# Patient Record
Sex: Female | Born: 1970 | Race: White | Hispanic: No | Smoking: Never smoker
Health system: Southern US, Community
[De-identification: ages and names within clinical notes are randomized; demographics above are authoritative.]

## PROBLEM LIST (undated history)

## (undated) DIAGNOSIS — T7840XA Allergy, unspecified, initial encounter: Secondary | ICD-10-CM

## (undated) DIAGNOSIS — R030 Elevated blood-pressure reading, without diagnosis of hypertension: Secondary | ICD-10-CM

## (undated) DIAGNOSIS — E669 Obesity, unspecified: Secondary | ICD-10-CM

## (undated) DIAGNOSIS — I10 Essential (primary) hypertension: Secondary | ICD-10-CM

## (undated) HISTORY — DX: Elevated blood-pressure reading, without diagnosis of hypertension: R03.0

## (undated) HISTORY — DX: Essential (primary) hypertension: I10

## (undated) HISTORY — PX: TUBAL LIGATION: SHX77

## (undated) HISTORY — DX: Allergy, unspecified, initial encounter: T78.40XA

## (undated) HISTORY — DX: Obesity, unspecified: E66.9

---

## 1999-07-14 ENCOUNTER — Other Ambulatory Visit: Admission: RE | Admit: 1999-07-14 | Discharge: 1999-07-14 | Payer: Self-pay | Admitting: *Deleted

## 2000-08-30 ENCOUNTER — Other Ambulatory Visit: Admission: RE | Admit: 2000-08-30 | Discharge: 2000-08-30 | Payer: Self-pay | Admitting: *Deleted

## 2001-11-04 ENCOUNTER — Inpatient Hospital Stay (HOSPITAL_COMMUNITY): Admission: AD | Admit: 2001-11-04 | Discharge: 2001-11-04 | Payer: Self-pay | Admitting: *Deleted

## 2001-11-05 ENCOUNTER — Inpatient Hospital Stay (HOSPITAL_COMMUNITY): Admission: AD | Admit: 2001-11-05 | Discharge: 2001-11-07 | Payer: Self-pay | Admitting: Obstetrics and Gynecology

## 2001-12-07 ENCOUNTER — Other Ambulatory Visit: Admission: RE | Admit: 2001-12-07 | Discharge: 2001-12-07 | Payer: Self-pay | Admitting: Obstetrics and Gynecology

## 2003-01-01 ENCOUNTER — Other Ambulatory Visit: Admission: RE | Admit: 2003-01-01 | Discharge: 2003-01-01 | Payer: Self-pay | Admitting: *Deleted

## 2003-02-02 ENCOUNTER — Ambulatory Visit (HOSPITAL_COMMUNITY): Admission: RE | Admit: 2003-02-02 | Discharge: 2003-02-02 | Payer: Self-pay | Admitting: *Deleted

## 2004-06-03 ENCOUNTER — Emergency Department (HOSPITAL_COMMUNITY): Admission: EM | Admit: 2004-06-03 | Discharge: 2004-06-04 | Payer: Self-pay | Admitting: Emergency Medicine

## 2004-10-02 ENCOUNTER — Other Ambulatory Visit: Admission: RE | Admit: 2004-10-02 | Discharge: 2004-10-02 | Payer: Self-pay | Admitting: Obstetrics and Gynecology

## 2005-11-17 ENCOUNTER — Other Ambulatory Visit: Admission: RE | Admit: 2005-11-17 | Discharge: 2005-11-17 | Payer: Self-pay | Admitting: Obstetrics and Gynecology

## 2005-11-25 ENCOUNTER — Encounter: Admission: RE | Admit: 2005-11-25 | Discharge: 2005-11-25 | Payer: Self-pay | Admitting: Obstetrics and Gynecology

## 2007-06-27 IMAGING — MG MM MAMMO SCREENING
6 series · 6 of 6 positions shown · non-contrast
Comparison: none

DIGITAL BILATERAL  DIAGNOSTIC MAMMOGRAM WITH CAD AND RIGHT BREAST ULTRASOUND:
CLINICAL DATA: Questioned palpable finding right axilla.

[R CC]
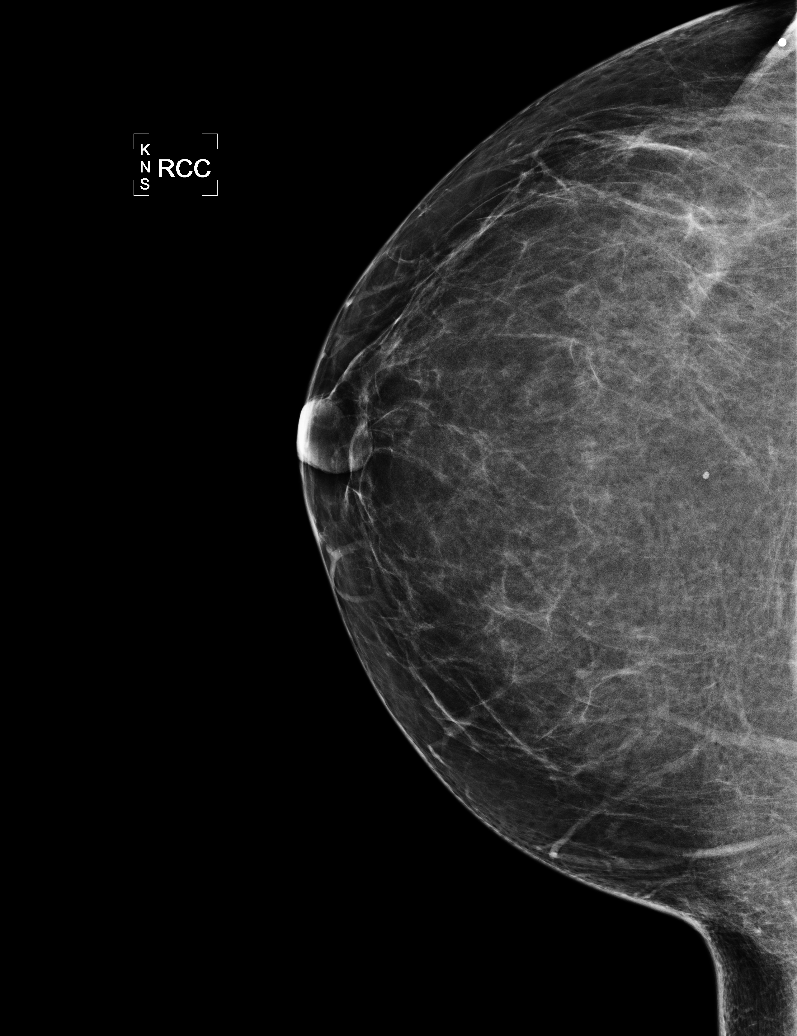

[R MLO]
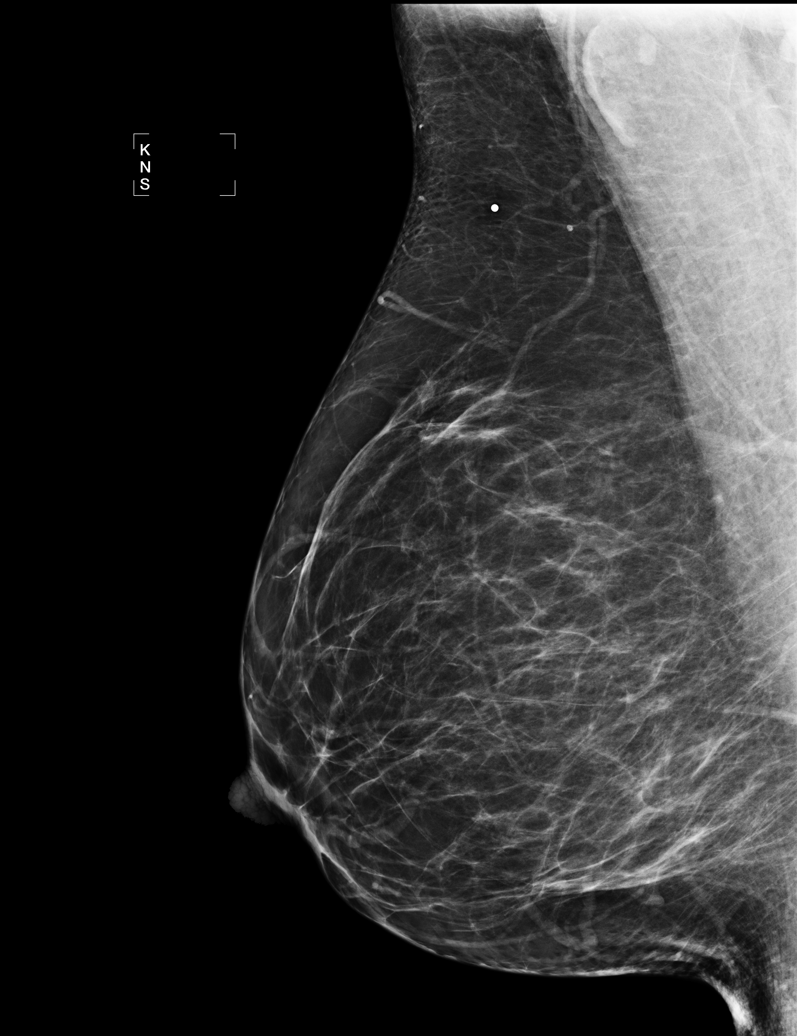

[L CC]
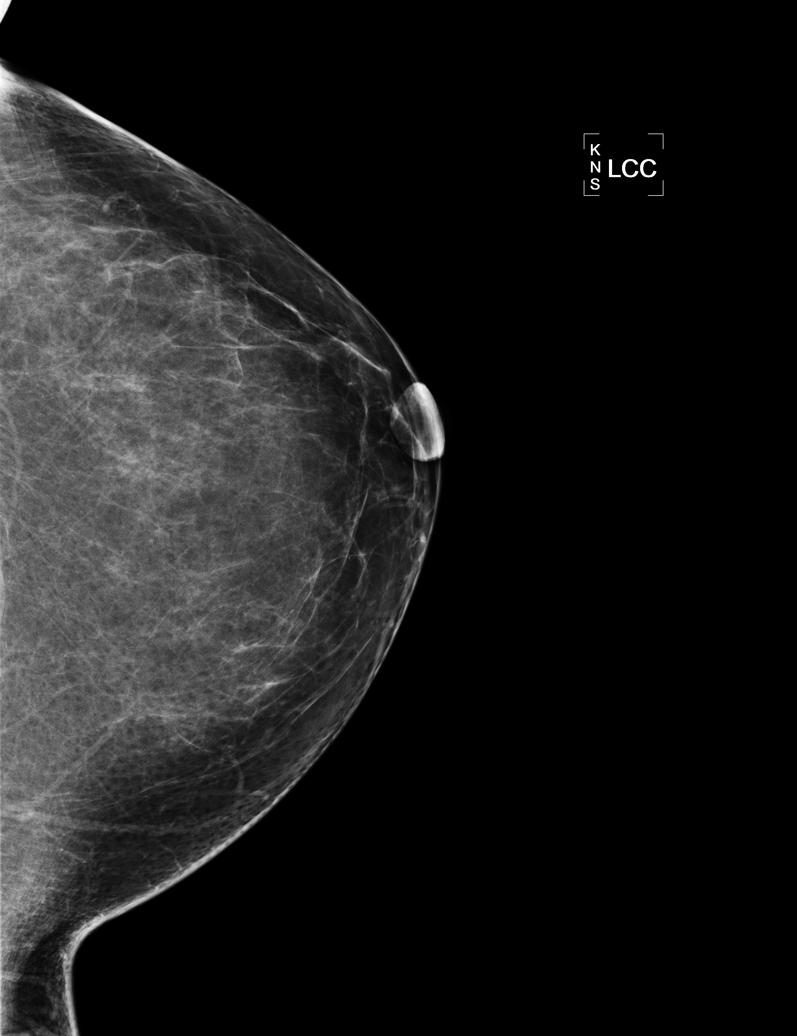

[L MLO]
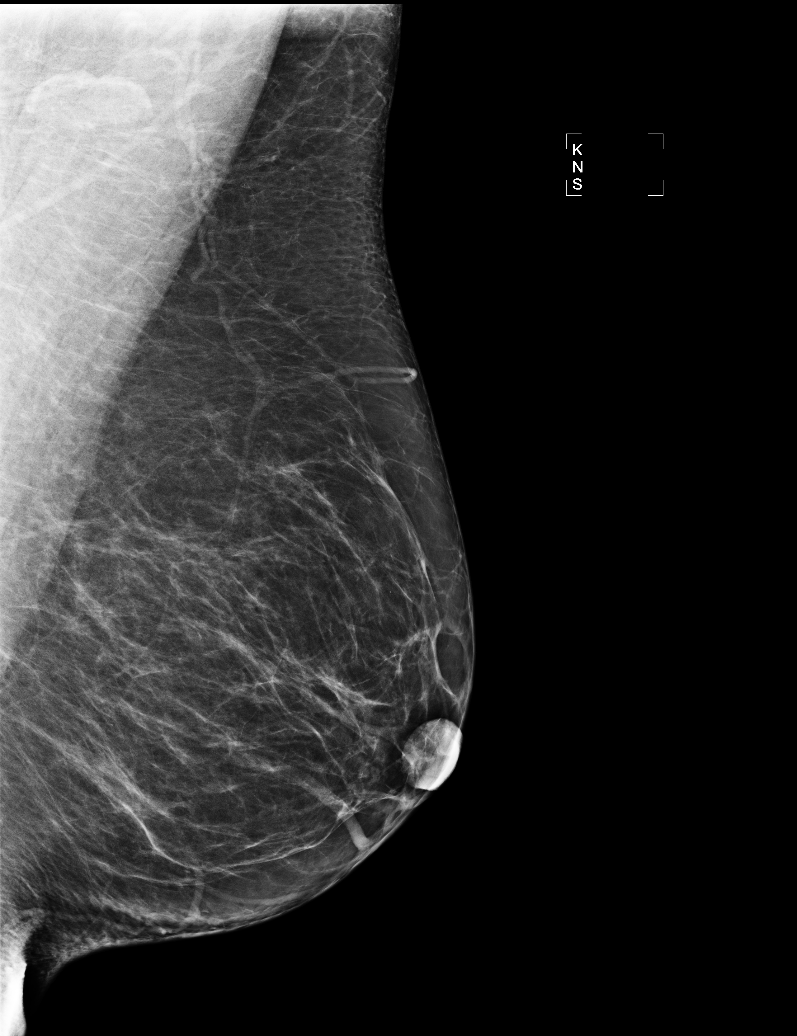

[R XCCL]
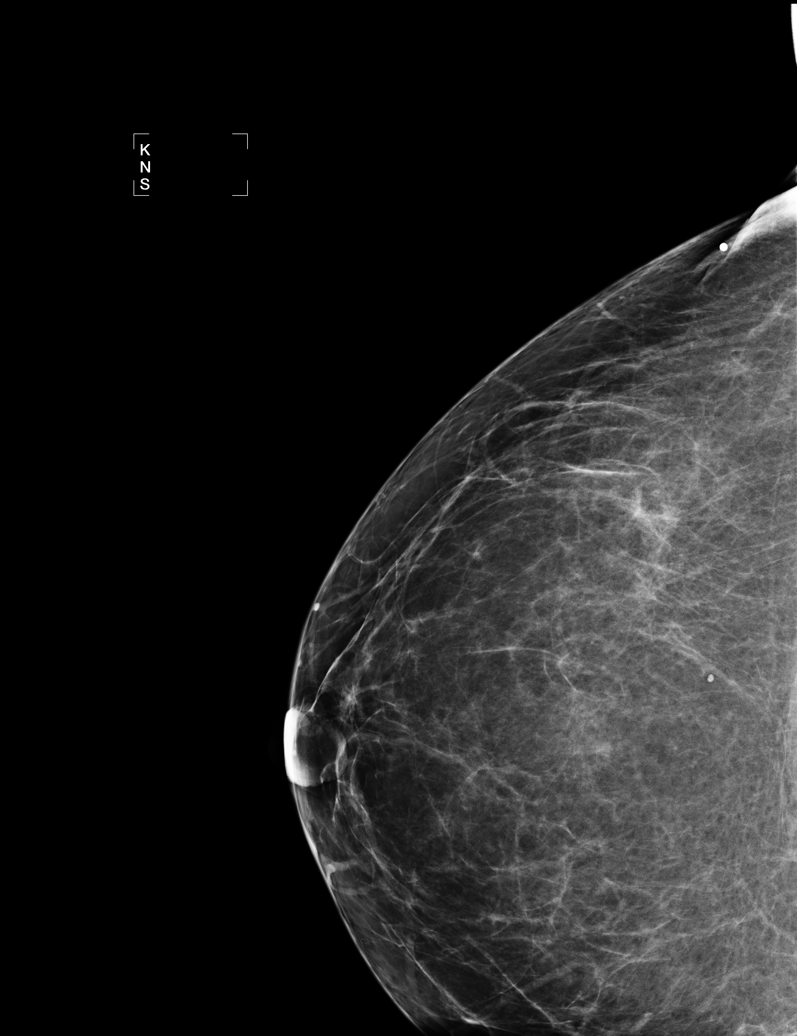

[R TAN]
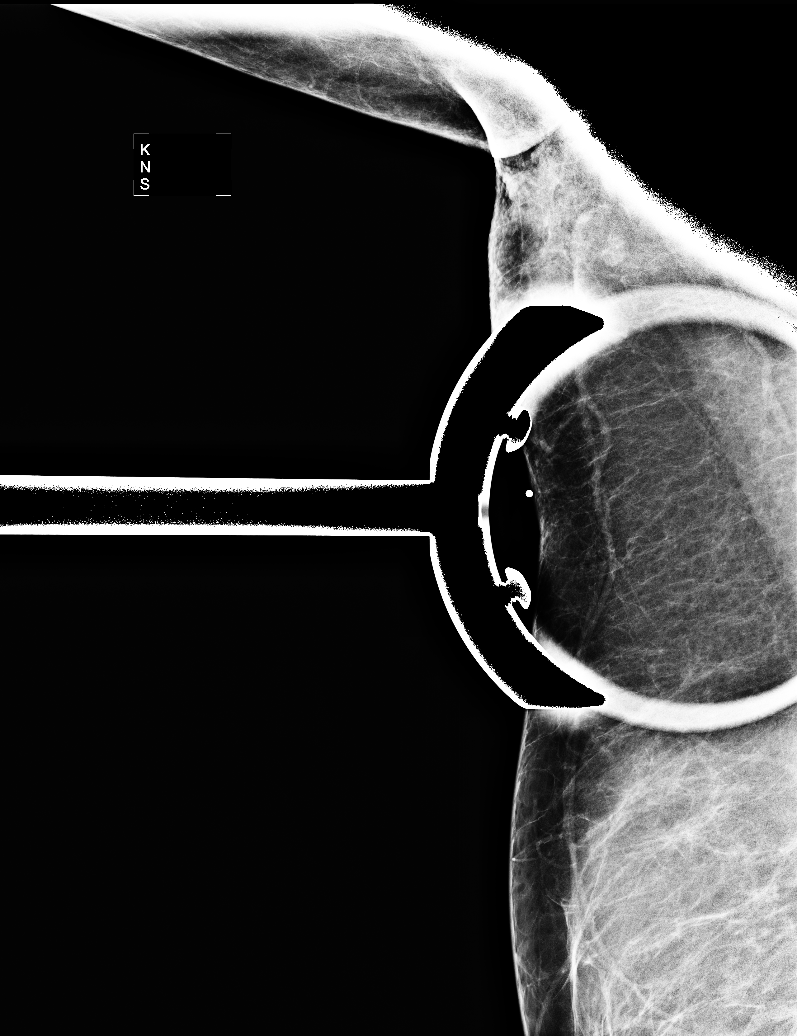

[6 of 6 positions shown; findings below may reference images not displayed]

No comparison is available.

The breast parenchyma is predominantly fatty.  No suspicious mass, calcification, or architectural 
distortion is seen to suggest malignancy.

On physical examination, I palpate a superficial 5 mm freely mobile nodule in the right upper outer
quadrant/axilla, corresponding to the patient's questioned palpable finding.  Directed ultrasound 
of this area demonstrates no sonographic abnormality.
IMPRESSION: No mammographic or sonographic evidence for malignancy.  The area in question may represent an 
isoechoic lymph node or normal ridge of adipose tissue.  The patient is recommended to continue 
breast self-examination and to contact us if this area enlarges or not resolves.  In the absence of
any change, the patient will be due for annual screening mammography beginning at age 40.  
Findings and recommendations discussed with the patient and provided in written form at the time of
the exam.

ASSESSMENT: Negative - BI-RADS 1

Routine screening mammogram at age 38.

## 2007-11-24 HISTORY — PX: BREAST ENHANCEMENT SURGERY: SHX7

## 2011-11-24 HISTORY — PX: CERVICAL ABLATION: SHX5771

## 2016-01-01 ENCOUNTER — Ambulatory Visit: Payer: Self-pay

## 2016-01-01 ENCOUNTER — Ambulatory Visit (INDEPENDENT_AMBULATORY_CARE_PROVIDER_SITE_OTHER): Payer: BLUE CROSS/BLUE SHIELD | Admitting: Podiatry

## 2016-01-01 DIAGNOSIS — M722 Plantar fascial fibromatosis: Secondary | ICD-10-CM

## 2016-01-01 MED ORDER — DICLOFENAC SODIUM 75 MG PO TBEC: 75.0000 mg | DELAYED_RELEASE_TABLET | Freq: Two times a day (BID) | ORAL | Status: AC

## 2016-01-01 MED ORDER — TRIAMCINOLONE ACETONIDE 10 MG/ML IJ SUSP
10.0000 mg | Freq: Once | INTRAMUSCULAR | Status: AC
  Administered 2016-01-01: 10 mg

## 2016-01-01 NOTE — Progress Notes (Signed)
   Subjective:    Patient ID: Erin Shelton, female    DOB: 1971-03-11, 45 y.o.   MRN: [id number]  HPI  Pt presents with pf pain in the left foot, last occurrence was 5 years ago. Current pain lasting 3 weeks  Review of Systems  All other systems reviewed and are negative.      Objective:   Physical Exam        Assessment & Plan:

## 2016-01-02 NOTE — Progress Notes (Signed)
Subjective:     Patient ID: Erin Shelton, female   DOB: [birth year], 45 y.o.   MRN: [id number]  HPI patient states I have a lot of pain in my left heel that's been going on for the last few months. States that she's had this before but it's been at least 5 years   Review of Systems  All other systems reviewed and are negative.      Objective:   Physical Exam  Constitutional: She is oriented to person, place, and time.  Cardiovascular: Intact distal pulses.   Musculoskeletal: Normal range of motion.  Neurological: She is oriented to person, place, and time.  Skin: Skin is warm.  Nursing note and vitals reviewed.  neurovascular status found to be intact with muscle strength adequate range of motion within normal limits. Patient's noted to have discomfort in the plantar heel left at the insertional point tendon calcaneus with fluid buildup around the medial band. Patient's noted to have exquisite pain when the areas palpated and does have good digital flow and is noted to be well oriented 3     Assessment:     Inflammatory plantar fasciitis left heel    Plan:     H&P and x-rays reviewed with patient. Injected the plantar fascial left 3 mg Kenalog 5 mill grams Xylocaine and applied fascial brace with instructions on usage. Placed on diclofenac 75 mg twice a day gave instructions on physical therapy and shoe gear modification and reappoint in the next 2 weeks

## 2016-01-15 ENCOUNTER — Encounter: Payer: Self-pay | Admitting: Podiatry

## 2016-01-15 ENCOUNTER — Ambulatory Visit (INDEPENDENT_AMBULATORY_CARE_PROVIDER_SITE_OTHER): Payer: BLUE CROSS/BLUE SHIELD | Admitting: Podiatry

## 2016-01-15 VITALS — BP 126/83 | HR 102 | Resp 16

## 2016-01-15 DIAGNOSIS — M722 Plantar fascial fibromatosis: Secondary | ICD-10-CM

## 2016-01-15 NOTE — Progress Notes (Signed)
Subjective:     Patient ID: Erin Shelton, female   DOB: Sep 24, 1971, 45 y.o.   MRN: [id number]  HPI patient states my heel is feeling much better   Review of Systems     Objective:   Physical Exam Neurovascular status intact with significant diminishment of discomfort plantar heel left at the insertional point tendon into the calcaneus    Assessment:     Significant improvement plantar fasciitis left    Plan:     Advised on physical therapy supportive shoe gear usage and anti-inflammatories. Patient will be seen back as needed

## 2016-03-18 ENCOUNTER — Ambulatory Visit (INDEPENDENT_AMBULATORY_CARE_PROVIDER_SITE_OTHER): Payer: BLUE CROSS/BLUE SHIELD | Admitting: Podiatry

## 2016-03-18 DIAGNOSIS — M722 Plantar fascial fibromatosis: Secondary | ICD-10-CM | POA: Diagnosis not present

## 2016-03-18 MED ORDER — TRIAMCINOLONE ACETONIDE 10 MG/ML IJ SUSP
10.0000 mg | Freq: Once | INTRAMUSCULAR | Status: AC
  Administered 2016-03-18: 10 mg

## 2016-03-18 NOTE — Progress Notes (Signed)
Subjective:     Patient ID: Erin Shelton, female   DOB: Jan 28, 1971, 45 y.o.   MRN: [id number]  HPI patient states she's going on a cruise and her left heel has been hurting her   Review of Systems     Objective:   Physical Exam Neurovascular status intact muscle strength adequate with exquisite discomfort plantar aspect left heel at the insertional point tendon into the calcaneus    Assessment:     Plantar fasciitis left with inflammation fluid buildup    Plan:     H&P and I administered injection of steroid 3 mg Kenalog 5 mill grams Xylocaine tolerated well and reappoint as needed and may require orthotics for the long-term

## 2018-10-04 ENCOUNTER — Encounter: Payer: Self-pay | Admitting: Gastroenterology

## 2018-11-02 ENCOUNTER — Ambulatory Visit: Payer: BLUE CROSS/BLUE SHIELD | Admitting: Gastroenterology

## 2018-11-02 ENCOUNTER — Encounter (INDEPENDENT_AMBULATORY_CARE_PROVIDER_SITE_OTHER): Payer: Self-pay

## 2018-11-02 ENCOUNTER — Encounter: Payer: Self-pay | Admitting: Gastroenterology

## 2018-11-02 VITALS — BP 138/86 | HR 78 | Ht 64.0 in | Wt 210.2 lb

## 2018-11-02 DIAGNOSIS — Z1211 Encounter for screening for malignant neoplasm of colon: Secondary | ICD-10-CM

## 2018-11-02 MED ORDER — PEG 3350-KCL-NA BICARB-NACL 420 G PO SOLR: 4000.0000 mL | ORAL | 0 refills | Status: DC

## 2018-11-02 NOTE — Patient Instructions (Addendum)
You will be set up for a colonoscopy for FH of colon polyps.  You have been scheduled for a colonoscopy. Please follow written instructions given to you at your visit today.  Please pick up your prep supplies at the pharmacy within the next 1-3 days. If you use inhalers (even only as needed), please bring them with you on the day of your procedure. Your physician has requested that you go to www.startemmi.com and enter the access code given to you at your visit today. This web site gives a general overview about your procedure. However, you should still follow specific instructions given to you by our office regarding your preparation for the procedure.  Thank you for entrusting me with your care and choosing [facility].  Dr Christella HartiganJacobs

## 2018-11-02 NOTE — Progress Notes (Signed)
HPI: This is a very pleasant 47 year old woman who was referred to me by Dr. Marcelle Overlie  Chief complaint is family history of colon polyps  She has no GI symptoms.  No bleeding, no significant constipation diarrhea or abdominal pains.  She has been slowly gaining weight recently.  Her mother, her father and her brother have all had polyps, she believes precancerous.  Her husband died from a perforated colon following a colonoscopy.  It sounds like he was quite ill around that time with non-Hodgkin's lymphoma and a mass in his abdomen.  She is also aware of an elderly friend of hers who had a perforation in her colon as well. she is understandably a bit nervous about the whole thing.   Review of systems: Pertinent positive and negative review of systems were noted in the above HPI section. All other review negative.   Past Medical History:  Diagnosis Date  . Blood pressure elevated without history of HTN   . Obesity     Past Surgical History:  Procedure Laterality Date  . TUBAL LIGATION      Current Outpatient Medications  Medication Sig Dispense Refill  . diclofenac (VOLTAREN) 75 MG EC tablet Take 1 tablet (75 mg total) by mouth 2 (two) times daily. 50 tablet 2  . hydrochlorothiazide (HYDRODIURIL) 50 MG tablet Take 50 mg by mouth daily.    . phentermine 37.5 MG capsule Take 37.5 mg by mouth every morning.     No current facility-administered medications for this visit.     Allergies as of 11/02/2018 - Review Complete 11/02/2018  Allergen Reaction Noted  . Clindamycin/lincomycin Hives 01/01/2016    Family History  Problem Relation Age of Onset  . Colon polyps Mother   . Colon polyps Father   . Colon polyps Brother     Social History   Socioeconomic History  . Marital status: Married    Spouse name: Not on file  . Number of children: 2  . Years of education: Not on file  . Highest education level: Not on file  Occupational History  . Occupation:  Public librarian  Social Needs  . Financial resource strain: Not on file  . Food insecurity:    Worry: Not on file    Inability: Not on file  . Transportation needs:    Medical: Not on file    Non-medical: Not on file  Tobacco Use  . Smoking status: Never Smoker  . Smokeless tobacco: Never Used  Substance and Sexual Activity  . Alcohol use: Yes  . Drug use: Never  . Sexual activity: Yes    Partners: Male  Lifestyle  . Physical activity:    Days per week: Not on file    Minutes per session: Not on file  . Stress: Not on file  Relationships  . Social connections:    Talks on phone: Not on file    Gets together: Not on file    Attends religious service: Not on file    Active member of club or organization: Not on file    Attends meetings of clubs or organizations: Not on file    Relationship status: Not on file  . Intimate partner violence:    Fear of current or ex partner: Not on file    Emotionally abused: Not on file    Physically abused: Not on file    Forced sexual activity: Not on file  Other Topics Concern  . Not on file  Social History Narrative  .  Not on file     Physical Exam: BP 138/86   Pulse 78   Ht 5\' 4"  (1.626 m)   Wt 210 lb 4 oz (95.4 kg)   BMI 36.09 kg/m  Constitutional: generally well-appearing Psychiatric: alert and oriented x3 Eyes: extraocular movements intact Mouth: oral pharynx moist, no lesions Neck: supple no lymphadenopathy Cardiovascular: heart regular rate and rhythm Lungs: clear to auscultation bilaterally Abdomen: soft, nontender, nondistended, no obvious ascites, no peritoneal signs, normal bowel sounds Extremities: no lower extremity edema bilaterally Skin: no lesions on visible extremities   Assessment and plan: 47 y.o. female with family history colon Polyps  Several family members have had polyps.  She is 47.  I recommended colonoscopy at her soonest convenience.  If it is normal without precancerous polyps then I would likely  recommend repeat examination at 10-year interval.  We discussed risks and benefits.  Among others we discussed potential perforation.  This is happened to people she knows including her ex-husband and he passed away from it.  I explained to her that I will be as safe as always and if any complication happens we will absolutely take care of her.  She understands and agrees to proceed.  I see no reason for any further blood tests or imaging studies prior to then.    Please see the "Patient Instructions" section for addition details about the plan.   Rob Buntinganiel Jacobs, MD  Gastroenterology 11/02/2018, 11:27 AM  Cc: Lester CarolinaMcFadden, John C., MD

## 2018-12-07 ENCOUNTER — Encounter: Payer: Self-pay | Admitting: Gastroenterology

## 2018-12-14 ENCOUNTER — Ambulatory Visit (AMBULATORY_SURGERY_CENTER): Payer: BLUE CROSS/BLUE SHIELD | Admitting: Gastroenterology

## 2018-12-14 ENCOUNTER — Encounter: Payer: Self-pay | Admitting: Gastroenterology

## 2018-12-14 VITALS — BP 114/78 | HR 74 | Temp 97.1°F | Resp 16 | Ht 64.0 in | Wt 210.2 lb

## 2018-12-14 DIAGNOSIS — Z1211 Encounter for screening for malignant neoplasm of colon: Secondary | ICD-10-CM

## 2018-12-14 MED ORDER — SODIUM CHLORIDE 0.9 % IV SOLN: 500.0000 mL | Freq: Once | INTRAVENOUS | Status: DC

## 2018-12-14 NOTE — Patient Instructions (Signed)
YOU HAD AN ENDOSCOPIC PROCEDURE TODAY AT THE [facility]:   Refer to the procedure report that was given to you for any specific questions about what was found during the examination.  If the procedure report does not answer your questions, please call your gastroenterologist to clarify.  If you requested that your care partner not be given the details of your procedure findings, then the procedure report has been included in a sealed envelope for you to review at your convenience later.  YOU SHOULD EXPECT: Some feelings of bloating in the abdomen. Passage of more gas than usual.  Walking can help get rid of the air that was put into your GI tract during the procedure and reduce the bloating. If you had a lower endoscopy (such as a colonoscopy or flexible sigmoidoscopy) you may notice spotting of blood in your stool or on the toilet paper. If you underwent a bowel prep for your procedure, you may not have a normal bowel movement for a few days.  Please Note:  You might notice some irritation and congestion in your nose or some drainage.  This is from the oxygen used during your procedure.  There is no need for concern and it should clear up in a day or so.  SYMPTOMS TO REPORT IMMEDIATELY:   Following lower endoscopy (colonoscopy or flexible sigmoidoscopy):  Excessive amounts of blood in the stool  Significant tenderness or worsening of abdominal pains  Swelling of the abdomen that is new, acute  Fever of 100F or higher  For urgent or emergent issues, a gastroenterologist can be reached at any hour by calling [phone].   DIET:  We do recommend a small meal at first, but then you may proceed to your regular diet.  Drink plenty of fluids but you should avoid alcoholic beverages for 24 hours.  ACTIVITY:  You should plan to take it easy for the rest of today and you should NOT DRIVE or use heavy machinery until tomorrow (because of the sedation medicines used during the test).     FOLLOW UP: Our staff will call the number listed on your records the next business day following your procedure to check on you and address any questions or concerns that you may have regarding the information given to you following your procedure. If we do not reach you, we will leave a message.  However, if you are feeling well and you are not experiencing any problems, there is no need to return our call.  We will assume that you have returned to your regular daily activities without incident.  If any biopsies were taken you will be contacted by phone or by letter within the next 1-3 weeks.  Please call us at [phone] if you have not heard about the biopsies in 3 weeks.    SIGNATURES/CONFIDENTIALITY: You and/or your care partner have signed paperwork which will be entered into your electronic medical record.  These signatures attest to the fact that that the information above on your After Visit Summary has been reviewed and is understood.  Full responsibility of the confidentiality of this discharge information lies with you and/or your care-partner. 

## 2018-12-14 NOTE — Op Note (Signed)
[facility] Patient Name: Erin Shelton Procedure Date: 12/14/2018 1:17 PM MRN: [id number] Endoscopist: Rachael Fee , MD Age: 48 Referring MD:  Date of Birth: [birth year] Gender: Female Account #: [id number] Procedure:                Colonoscopy Indications:              Colon cancer screening in patient at increased                            risk: Family history of colon polyps in multiple                            1st-degree relatives Medicines:                Monitored Anesthesia Care Procedure:                Pre-Anesthesia Assessment:                           - Prior to the procedure, a History and Physical                            was performed, and patient medications and                            allergies were reviewed. The patient's tolerance of                            previous anesthesia was also reviewed. The risks                            and benefits of the procedure and the sedation                            options and risks were discussed with the patient.                            All questions were answered, and informed consent                            was obtained. Prior Anticoagulants: The patient has                            taken no previous anticoagulant or antiplatelet                            agents. ASA Grade Assessment: II - A patient with                            mild systemic disease. After reviewing the risks                            and benefits, the patient was deemed in  satisfactory condition to undergo the procedure.                           After obtaining informed consent, the colonoscope                            was passed under direct vision. Throughout the                            procedure, the patient's blood pressure, pulse, and                            oxygen saturations were monitored continuously. The                            Colonoscope was introduced through the  anus and                            advanced to the the cecum, identified by                            appendiceal orifice and ileocecal valve. The                            colonoscopy was performed without difficulty. The                            patient tolerated the procedure well. The quality                            of the bowel preparation was good. The ileocecal                            valve, appendiceal orifice, and rectum were                            photographed. Scope In: 1:23:46 PM Scope Out: 1:36:40 PM Scope Withdrawal Time: 0 hours 9 minutes 45 seconds  Total Procedure Duration: 0 hours 12 minutes 54 seconds  Findings:                 The entire examined colon appeared normal on direct                            and retroflexion views. Complications:            No immediate complications. Estimated blood loss:                            None. Estimated Blood Loss:     Estimated blood loss: none. Impression:               - The entire examined colon is normal on direct and                            retroflexion views.                           -  No polyps or cancers. Recommendation:           - Patient has a contact number available for                            emergencies. The signs and symptoms of potential                            delayed complications were discussed with the                            patient. Return to normal activities tomorrow.                            Written discharge instructions were provided to the                            patient.                           - Resume previous diet.                           - Continue present medications.                           - Repeat colonoscopy in 10 years for screening. Rachael Fee, MD 12/14/2018 1:40:11 PM This report has been signed electronically.

## 2018-12-14 NOTE — Progress Notes (Signed)
PT taken to PACU. Monitors in place. VSS. Report given to RN. 

## 2018-12-15 ENCOUNTER — Telehealth: Payer: Self-pay

## 2018-12-15 NOTE — Telephone Encounter (Signed)
First post procedure follow up call, no answer 

## 2018-12-15 NOTE — Telephone Encounter (Signed)
  Follow up Call-  Call back number 12/14/2018  Post procedure Call Back phone  # [phone]  Permission to leave phone message Yes  Some recent data might be hidden     Patient questions:  Do you have a fever, pain , or abdominal swelling? No. Pain Score  0 *  Have you tolerated food without any problems? Yes.    Have you been able to return to your normal activities? Yes.    Do you have any questions about your discharge instructions: Diet   No. Medications  No. Follow up visit  No.  Do you have questions or concerns about your Care? No.  Actions: * If pain score is 4 or above: No action needed, pain <4.  Follow up Call-  Call back number 12/14/2018  Post procedure Call Back phone  # [phone]  Permission to leave phone message Yes  Some recent data might be hidden     Patient questions:  Do you have a fever, pain , or abdominal swelling? No. Pain Score  0 *  Have you tolerated food without any problems? Yes.    Have you been able to return to your normal activities? Yes.    Do you have any questions about your discharge instructions: Diet   No. Medications  No. Follow up visit  No.  Do you have questions or concerns about your Care? No.  Actions: * If pain score is 4 or above: No action needed, pain <4.

## 2021-12-11 ENCOUNTER — Other Ambulatory Visit: Payer: Self-pay | Admitting: Obstetrics and Gynecology

## 2021-12-11 DIAGNOSIS — R928 Other abnormal and inconclusive findings on diagnostic imaging of breast: Secondary | ICD-10-CM

## 2022-01-12 ENCOUNTER — Other Ambulatory Visit: Payer: BLUE CROSS/BLUE SHIELD

## 2024-01-10 ENCOUNTER — Other Ambulatory Visit (HOSPITAL_BASED_OUTPATIENT_CLINIC_OR_DEPARTMENT_OTHER): Payer: Self-pay | Admitting: Obstetrics and Gynecology

## 2024-01-10 DIAGNOSIS — Z13220 Encounter for screening for lipoid disorders: Secondary | ICD-10-CM

## 2024-01-25 ENCOUNTER — Ambulatory Visit (HOSPITAL_COMMUNITY)
Admission: RE | Admit: 2024-01-25 | Discharge: 2024-01-25 | Disposition: A | Discharge status: Home / Self Care | Payer: Self-pay | Source: Ambulatory Visit | Attending: Obstetrics and Gynecology | Admitting: Obstetrics and Gynecology

## 2024-01-25 DIAGNOSIS — Z13220 Encounter for screening for lipoid disorders: Secondary | ICD-10-CM | POA: Insufficient documentation
